# Patient Record
Sex: Male | Born: 1980 | Race: White | Hispanic: No | Marital: Married | Smoking: Current every day smoker
Health system: Southern US, Community
[De-identification: ages and names within clinical notes are randomized; demographics above are authoritative.]

## PROBLEM LIST (undated history)

## (undated) DIAGNOSIS — E7800 Pure hypercholesterolemia, unspecified: Secondary | ICD-10-CM

---

## 2001-02-05 ENCOUNTER — Emergency Department (HOSPITAL_COMMUNITY): Admission: EM | Admit: 2001-02-05 | Discharge: 2001-02-05 | Payer: Self-pay | Admitting: Emergency Medicine

## 2001-02-06 ENCOUNTER — Emergency Department (HOSPITAL_COMMUNITY): Admission: EM | Admit: 2001-02-06 | Discharge: 2001-02-06 | Payer: Self-pay | Admitting: Emergency Medicine

## 2002-08-02 ENCOUNTER — Emergency Department (HOSPITAL_COMMUNITY): Admission: EM | Admit: 2002-08-02 | Discharge: 2002-08-02 | Payer: Self-pay | Admitting: Emergency Medicine

## 2005-12-23 ENCOUNTER — Ambulatory Visit: Payer: Self-pay | Admitting: Internal Medicine

## 2005-12-29 ENCOUNTER — Ambulatory Visit: Payer: Self-pay | Admitting: Internal Medicine

## 2006-04-17 ENCOUNTER — Ambulatory Visit: Payer: Self-pay | Admitting: Family Medicine

## 2007-07-05 ENCOUNTER — Encounter (INDEPENDENT_AMBULATORY_CARE_PROVIDER_SITE_OTHER): Payer: Self-pay | Admitting: *Deleted

## 2007-07-05 ENCOUNTER — Ambulatory Visit: Payer: Self-pay | Admitting: Internal Medicine

## 2007-07-05 DIAGNOSIS — F329 Major depressive disorder, single episode, unspecified: Secondary | ICD-10-CM

## 2007-07-05 DIAGNOSIS — E785 Hyperlipidemia, unspecified: Secondary | ICD-10-CM | POA: Insufficient documentation

## 2007-07-05 DIAGNOSIS — I10 Essential (primary) hypertension: Secondary | ICD-10-CM | POA: Insufficient documentation

## 2007-07-05 DIAGNOSIS — G43009 Migraine without aura, not intractable, without status migrainosus: Secondary | ICD-10-CM | POA: Insufficient documentation

## 2007-07-05 DIAGNOSIS — N289 Disorder of kidney and ureter, unspecified: Secondary | ICD-10-CM | POA: Insufficient documentation

## 2007-07-05 DIAGNOSIS — L989 Disorder of the skin and subcutaneous tissue, unspecified: Secondary | ICD-10-CM | POA: Insufficient documentation

## 2007-07-05 DIAGNOSIS — F411 Generalized anxiety disorder: Secondary | ICD-10-CM | POA: Insufficient documentation

## 2007-07-05 LAB — CONVERTED CEMR LAB
AST: 22 units/L (ref 0–37)
Albumin: 4.1 g/dL (ref 3.5–5.2)
Alkaline Phosphatase: 81 units/L (ref 39–117)
BUN: 15 mg/dL (ref 6–23)
Basophils Absolute: 0 10*3/uL (ref 0.0–0.1)
Bilirubin Urine: NEGATIVE
Chloride: 103 meq/L (ref 96–112)
Cholesterol: 198 mg/dL (ref 0–200)
Creatinine, Ser: 0.7 mg/dL (ref 0.4–1.5)
Eosinophils Absolute: 0.2 10*3/uL (ref 0.0–0.6)
GFR calc non Af Amer: 144 mL/min
HDL: 36.6 mg/dL — ABNORMAL LOW (ref >39.0)
Hemoglobin, Urine: NEGATIVE
Ketones, ur: NEGATIVE mg/dL
MCHC: 35.3 g/dL (ref 30.0–36.0)
Monocytes Relative: 5.5 % (ref 3.0–11.0)
Potassium: 4.3 meq/L (ref 3.5–5.1)
RBC: 5.1 M/uL (ref 4.22–5.81)
RDW: 12.3 % (ref 11.5–14.6)
TSH: 1.22 microintl units/mL (ref 0.35–5.50)
Total Bilirubin: 0.8 mg/dL (ref 0.3–1.2)
Total CHOL/HDL Ratio: 5.4
Triglycerides: 311 mg/dL (ref 0–149)
Urine Glucose: NEGATIVE mg/dL
Urobilinogen, UA: 0.2 (ref 0.0–1.0)
VLDL: 62 mg/dL — ABNORMAL HIGH (ref 0–40)
pH: 6.5 (ref 5.0–8.0)

## 2007-07-06 LAB — CONVERTED CEMR LAB

## 2007-10-20 ENCOUNTER — Encounter: Payer: Self-pay | Admitting: Internal Medicine

## 2007-10-21 ENCOUNTER — Encounter: Payer: Self-pay | Admitting: Internal Medicine

## 2008-09-20 ENCOUNTER — Ambulatory Visit: Payer: Self-pay | Admitting: Internal Medicine

## 2008-09-20 DIAGNOSIS — K645 Perianal venous thrombosis: Secondary | ICD-10-CM | POA: Insufficient documentation

## 2008-11-05 ENCOUNTER — Ambulatory Visit: Payer: Self-pay | Admitting: Internal Medicine

## 2008-11-05 LAB — CONVERTED CEMR LAB
Bilirubin, Direct: 0.1 mg/dL (ref 0.0–0.3)
Calcium: 9.2 mg/dL (ref 8.4–10.5)
Eosinophils Absolute: 0.2 10*3/uL (ref 0.0–0.7)
GFR calc non Af Amer: 121.33 mL/min (ref >60)
Glucose, Bld: 100 mg/dL — ABNORMAL HIGH (ref 70–99)
HCT: 42 % (ref 39.0–52.0)
HDL: 33.2 mg/dL — ABNORMAL LOW (ref >39.00)
Hemoglobin, Urine: NEGATIVE
Hemoglobin: 14.8 g/dL (ref 13.0–17.0)
Ketones, ur: NEGATIVE mg/dL
LDL Cholesterol: 124 mg/dL — ABNORMAL HIGH (ref 0–99)
Monocytes Absolute: 0.3 10*3/uL (ref 0.1–1.0)
Neutrophils Relative %: 63.5 % (ref 43.0–77.0)
Platelets: 145 10*3/uL — ABNORMAL LOW (ref 150.0–400.0)
Potassium: 3.8 meq/L (ref 3.5–5.1)
RBC: 4.79 M/uL (ref 4.22–5.81)
RDW: 13.3 % (ref 11.5–14.6)
Sodium: 142 meq/L (ref 135–145)
Total Bilirubin: 0.9 mg/dL (ref 0.3–1.2)
Total CHOL/HDL Ratio: 6
Triglycerides: 150 mg/dL — ABNORMAL HIGH (ref 0.0–149.0)
Urine Glucose: NEGATIVE mg/dL
Urobilinogen, UA: 0.2 (ref 0.0–1.0)
VLDL: 30 mg/dL (ref 0.0–40.0)

## 2009-06-02 ENCOUNTER — Emergency Department (HOSPITAL_COMMUNITY): Admission: EM | Admit: 2009-06-02 | Discharge: 2009-06-02 | Payer: Self-pay | Admitting: Emergency Medicine

## 2016-07-29 ENCOUNTER — Encounter (HOSPITAL_COMMUNITY): Payer: Self-pay | Admitting: Emergency Medicine

## 2016-07-29 ENCOUNTER — Ambulatory Visit (HOSPITAL_COMMUNITY)
Admission: EM | Admit: 2016-07-29 | Discharge: 2016-07-30 | Disposition: A | Discharge status: Home / Self Care | Payer: Managed Care, Other (non HMO) | Attending: Emergency Medicine | Admitting: Emergency Medicine

## 2016-07-29 ENCOUNTER — Emergency Department (HOSPITAL_COMMUNITY): Payer: Managed Care, Other (non HMO)

## 2016-07-29 ENCOUNTER — Encounter (HOSPITAL_COMMUNITY)
Admission: EM | Disposition: A | Discharge status: Home / Self Care | Payer: Self-pay | Source: Home / Self Care | Attending: Emergency Medicine

## 2016-07-29 DIAGNOSIS — F17200 Nicotine dependence, unspecified, uncomplicated: Secondary | ICD-10-CM | POA: Insufficient documentation

## 2016-07-29 DIAGNOSIS — W540XXA Bitten by dog, initial encounter: Secondary | ICD-10-CM | POA: Insufficient documentation

## 2016-07-29 DIAGNOSIS — S68624A Partial traumatic transphalangeal amputation of right ring finger, initial encounter: Secondary | ICD-10-CM | POA: Insufficient documentation

## 2016-07-29 DIAGNOSIS — S68119A Complete traumatic metacarpophalangeal amputation of unspecified finger, initial encounter: Secondary | ICD-10-CM

## 2016-07-29 HISTORY — PX: I&D EXTREMITY: SHX5045

## 2016-07-29 SURGERY — IRRIGATION AND DEBRIDEMENT EXTREMITY
Anesthesia: General | Site: Finger | Laterality: Right

## 2016-07-29 MED ORDER — SODIUM CHLORIDE 0.9 % IV SOLN
3.0000 g | Freq: Once | INTRAVENOUS | Status: AC
  Administered 2016-07-29: 3 g via INTRAVENOUS
  Filled 2016-07-29: qty 3

## 2016-07-29 MED ORDER — LACTATED RINGERS IV SOLN
INTRAVENOUS | Status: DC | PRN
  Administered 2016-07-29: via INTRAVENOUS

## 2016-07-29 MED ORDER — TETANUS-DIPHTH-ACELL PERTUSSIS 5-2.5-18.5 LF-MCG/0.5 IM SUSP
0.5000 mL | Freq: Once | INTRAMUSCULAR | Status: AC
  Administered 2016-07-29: 0.5 mL via INTRAMUSCULAR
  Filled 2016-07-29: qty 0.5

## 2016-07-29 MED ORDER — PROPOFOL 10 MG/ML IV BOLUS
INTRAVENOUS | Status: AC
  Filled 2016-07-29: qty 20

## 2016-07-29 MED ORDER — SUFENTANIL CITRATE 50 MCG/ML IV SOLN
INTRAVENOUS | Status: AC
  Filled 2016-07-29: qty 1

## 2016-07-29 MED ORDER — HYDROMORPHONE HCL 2 MG/ML IJ SOLN: 1.0000 mg | Freq: Once | INTRAMUSCULAR | Status: DC

## 2016-07-29 MED ORDER — MIDAZOLAM HCL 2 MG/2ML IJ SOLN
INTRAMUSCULAR | Status: AC
  Filled 2016-07-29: qty 2

## 2016-07-29 MED ORDER — HYDROMORPHONE HCL 2 MG/ML IJ SOLN
1.0000 mg | Freq: Once | INTRAMUSCULAR | Status: AC
  Administered 2016-07-29: 1 mg via INTRAVENOUS
  Filled 2016-07-29: qty 1

## 2016-07-29 SURGICAL SUPPLY — 44 items
BANDAGE COBAN STERILE 2 (GAUZE/BANDAGES/DRESSINGS) IMPLANT
BLADE MINI RND TIP GREEN BEAV (BLADE) IMPLANT
BLADE SURG 15 STRL LF DISP TIS (BLADE) ×1 IMPLANT
BLADE SURG 15 STRL SS (BLADE) ×2
BNDG COHESIVE 1X5 TAN STRL LF (GAUZE/BANDAGES/DRESSINGS) ×3 IMPLANT
BNDG COHESIVE 4X5 TAN STRL (GAUZE/BANDAGES/DRESSINGS) ×3 IMPLANT
BNDG CONFORM 2 STRL LF (GAUZE/BANDAGES/DRESSINGS) ×3 IMPLANT
BNDG GAUZE ELAST 4 BULKY (GAUZE/BANDAGES/DRESSINGS) IMPLANT
CHLORAPREP W/TINT 26ML (MISCELLANEOUS) ×3 IMPLANT
CORDS BIPOLAR (ELECTRODE) ×3 IMPLANT
COVER BACK TABLE 60X90IN (DRAPES) ×3 IMPLANT
COVER MAYO STAND STRL (DRAPES) ×3 IMPLANT
CUFF TOURNIQUET SINGLE 18IN (TOURNIQUET CUFF) IMPLANT
DRAPE HALF SHEET 40X57 (DRAPES) ×3 IMPLANT
DRAPE SURG 17X23 STRL (DRAPES) ×3 IMPLANT
DRSG EMULSION OIL 3X3 NADH (GAUZE/BANDAGES/DRESSINGS) ×3 IMPLANT
GAUZE SPONGE 2X2 8PLY STRL LF (GAUZE/BANDAGES/DRESSINGS) ×1 IMPLANT
GAUZE SPONGE 4X4 12PLY STRL (GAUZE/BANDAGES/DRESSINGS) IMPLANT
GLOVE BIO SURGEON STRL SZ7.5 (GLOVE) ×3 IMPLANT
GLOVE BIOGEL PI IND STRL 7.0 (GLOVE) IMPLANT
GLOVE BIOGEL PI IND STRL 8 (GLOVE) ×1 IMPLANT
GLOVE BIOGEL PI INDICATOR 7.0 (GLOVE)
GLOVE BIOGEL PI INDICATOR 8 (GLOVE) ×2
GLOVE ECLIPSE 6.5 STRL STRAW (GLOVE) IMPLANT
GOWN STRL REUS W/ TWL LRG LVL3 (GOWN DISPOSABLE) ×2 IMPLANT
GOWN STRL REUS W/TWL LRG LVL3 (GOWN DISPOSABLE) ×4
GOWN STRL REUS W/TWL XL LVL3 (GOWN DISPOSABLE) ×3 IMPLANT
NEEDLE HYPO 25X1 1.5 SAFETY (NEEDLE) IMPLANT
NS IRRIG 1000ML POUR BTL (IV SOLUTION) ×3 IMPLANT
PACK BASIN DAY SURGERY FS (CUSTOM PROCEDURE TRAY) ×3 IMPLANT
PADDING CAST ABS 4INX4YD NS (CAST SUPPLIES)
PADDING CAST ABS COTTON 4X4 ST (CAST SUPPLIES) IMPLANT
RUBBERBAND STERILE (MISCELLANEOUS) IMPLANT
SET IRRIG Y TYPE TUR BLADDER L (SET/KITS/TRAYS/PACK) IMPLANT
SPONGE GAUZE 2X2 STER 10/PKG (GAUZE/BANDAGES/DRESSINGS) ×2
STOCKINETTE 6  STRL (DRAPES) ×2
STOCKINETTE 6 STRL (DRAPES) ×1 IMPLANT
SUT VICRYL RAPIDE 4-0 (SUTURE) ×3 IMPLANT
SUT VICRYL RAPIDE 4/0 PS 2 (SUTURE) IMPLANT
SYR BULB 3OZ (MISCELLANEOUS) ×3 IMPLANT
SYRINGE 10CC LL (SYRINGE) ×3 IMPLANT
TOWEL OR 17X24 6PK STRL BLUE (TOWEL DISPOSABLE) ×3 IMPLANT
TOWEL OR NON WOVEN STRL DISP B (DISPOSABLE) ×3 IMPLANT
UNDERPAD 30X30 (UNDERPADS AND DIAPERS) ×3 IMPLANT

## 2016-07-29 NOTE — Consult Note (Addendum)
  ORTHOPAEDIC CONSULTATION HISTORY & PHYSICAL REQUESTING PHYSICIAN: Jesse KaplanAnkit Nanavati, MD  Chief Complaint: Right ring finger traumatic amputation  HPI: Jesse Arnold is a 35 y.o. male who presents to the ER for evaluation of a right ring finger traumatic amputation to the DIP joint that occurred as a result of a bite from a large dog just prior to presentation.  The amputated portion was brought with him to the emergency department.  He has received Unasyn and his tetanus was updated.  History reviewed. No pertinent past medical history. History reviewed. No pertinent surgical history. Social History   Social History  . Marital status: Married    Spouse name: N/A  . Number of children: N/A  . Years of education: N/A   Social History Main Topics  . Smoking status: Current Every Day Smoker  . Smokeless tobacco: Never Used  . Alcohol use Yes  . Drug use: No  . Sexual activity: Not Asked   Other Topics Concern  . None   Social History Narrative  . None   History reviewed. No pertinent family history. No Known Allergies Prior to Admission medications   Not on File   Dg Hand 2 View Right  Result Date: 07/29/2016 CLINICAL DATA:  Dog bite EXAM: RIGHT HAND - 2 VIEW COMPARISON:  None. FINDINGS: Soft tissue and bony amputation at the level of the fourth DIP joint. Possible tiny bone fragment within the soft tissues along the ulnar aspect of the remaining distal digit. No subluxation. IMPRESSION: Bony and soft tissue appy to a shin at the level of the fourth DIP joint Electronically Signed   By: Jesse PangKim  Arnold M.D.   On: 07/29/2016 22:46    Positive ROS: All other systems have been reviewed and were otherwise negative with the exception of those mentioned in the HPI and as above.  Physical Exam: Vitals: Refer to EMR. Constitutional:  WD, WN, NAD HEENT:  NCAT, EOMI Neuro/Psych:  Alert & oriented to person, place, and time; appropriate mood & affect Lymphatic: No generalized  extremity edema or lymphadenopathy Extremities / MSK:  The extremities are normal with respect to appearance, ranges of motion, joint stability, muscle strength/tone, sensation, & perfusion except as otherwise noted:  Right ring finger amputation through the DIP joint, without significant fracture.  Soft tissues have yielded at or just distal to the DIP flexion crease.  He is able to flex and extend the remaining stump.  Assessment: Right ring finger traumatic amputation at level of DIP joint, unsuitable for replantation  Plan: I discussed these findings with him and the need for revision amputation in the operating room.  I indicated that the middle phalanx of the shortened slightly to allow for primary wound closure.  Questions were invited and answered.  Goals, risks, and options were reviewed.  Consent was obtained and document executed.  Site was marked by me in ink.  He will be afforded operative treatment as soon as resources allow.  Jesse Astersavid A. Janee Mornhompson, MD      [facility]opaedic & Hand Surgery [facility] [address] [place], [place]  [address] Office: [phone] Mobile: [phone]  07/29/2016, 11:57 PM

## 2016-07-29 NOTE — ED Notes (Signed)
Pt presents approximately an hour after breaking up fighting dogs.  These were his dogs and they are up to date on vaccines.  One of the dogs bit the distal tip of his r ring finger off.  Bleeding controlled.

## 2016-07-29 NOTE — ED Provider Notes (Signed)
[facility]-EMERGENCY DEPT Provider Note   CSN: [id number] Arrival date & time: 07/29/16  2109         History   Chief Complaint Chief Complaint  Patient presents with  . Finger Amputation    Dog Bite    HPI Jesse Arnold is a 35 y.o. male.   Animal Bite  Contact animal:  Dog Location:  Finger Finger injury location:  R ring finger Time since incident:  1 hour Pain details:    Quality:  Aching   Severity:  Moderate   Timing:  Constant   Progression:  Unchanged Incident location:  Home Provoked: provoked   Animal's rabies vaccination status:  Up to date Animal in possession: yes   Tetanus status:  Unknown Relieved by:  Nothing Worsened by:  Nothing Ineffective treatments:  None tried Associated symptoms: no fever and no rash     History reviewed. No pertinent past medical history.  Patient Active Problem List   Diagnosis Date Noted  . 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 HYPERLIPIDEMIA 07/05/2007  . ANXIETY 07/05/2007  . DEPRESSION 07/05/2007  . COMMON MIGRAINE 07/05/2007  . HYPERTENSION 07/05/2007  . RENAL DISEASE 07/05/2007  . SKIN LESIONS, MULTIPLE 07/05/2007    History reviewed. No pertinent surgical history.     Home Medications    Prior to Admission medications   Not on File    Family History No family history on file.  Social History Social History  Substance Use Topics  . Smoking status: Current Every Day Smoker  . Smokeless tobacco: Never Used  . Alcohol use Yes     Allergies   Patient has no known allergies.   Review of Systems Review of Systems  Constitutional: Negative for chills and fever.  HENT: Negative for ear pain and sore throat.   Eyes: Negative for pain and visual disturbance.  Respiratory: Negative for cough and shortness of breath.   Cardiovascular: Negative for chest pain and palpitations.  Gastrointestinal: Negative for abdominal pain and vomiting.  Genitourinary: Negative for dysuria and  hematuria.  Musculoskeletal: Negative for arthralgias and back pain.  Skin: Positive for wound. Negative for color change and rash.  Neurological: Negative for seizures and syncope.  All other systems reviewed and are negative.    Physical Exam Updated Vital Signs BP 138/97 (BP Location: Left Arm)   Pulse 106   Temp 98.1 F (36.7 C) (Oral)   Resp 20   Ht 6\' 2"  (1.88 m)   Wt 111.1 kg   SpO2 95%   BMI 31.46 kg/m   Physical Exam  Constitutional: He is oriented to person, place, and time. He appears well-developed and well-nourished.  HENT:  Head: Normocephalic and atraumatic.  Eyes: Conjunctivae are normal.  Neck: Neck supple.  Cardiovascular: Normal rate and regular rhythm.   No murmur heard. Pulmonary/Chest: Effort normal and breath sounds normal. No respiratory distress.  Abdominal: Soft. There is no tenderness.  Musculoskeletal: He exhibits no edema.       Right hand: He exhibits tenderness, deformity and laceration. He exhibits normal range of motion.  Distal phalanx of the right ring finger is amputated with exposed bone and soft tissue, motor function of the remaining finger is intact and sensation is as well.  Neurological: He is alert and oriented to person, place, and time.  Skin: Skin is warm and dry.  Psychiatric: He has a normal mood and affect.  Nursing note and vitals reviewed.    ED Treatments / Results  Labs (all  labs ordered are listed, but only abnormal results are displayed) Labs Reviewed - No data to display  EKG  EKG Interpretation None       Radiology Dg Hand 2 View Right  Result Date: 07/29/2016 CLINICAL DATA:  Dog bite EXAM: RIGHT HAND - 2 VIEW COMPARISON:  None. FINDINGS: Soft tissue and bony amputation at the level of the fourth DIP joint. Possible tiny bone fragment within the soft tissues along the ulnar aspect of the remaining distal digit. No subluxation. IMPRESSION: Bony and soft tissue appy to a shin at the level of the fourth DIP  joint Electronically Signed   By: Jasmine PangKim  Fujinaga M.D.   On: 07/29/2016 22:46    Procedures Procedures (including critical care time)  Medications Ordered in ED Medications  Tdap (BOOSTRIX) injection 0.5 mL (0.5 mLs Intramuscular Given 07/29/16 2251)  HYDROmorphone (DILAUDID) injection 1 mg (1 mg Intravenous Given 07/29/16 2216)  Ampicillin-Sulbactam (UNASYN) 3 g in sodium chloride 0.9 % 100 mL IVPB (3 g Intravenous New Bag/Given 07/29/16 2247)     Initial Impression / Assessment and Plan / ED Course  I have reviewed the triage vital signs and the nursing notes.  Pertinent labs & imaging results that were available during my care of the patient were reviewed by me and considered in my medical decision making (see chart for details).  Clinical Course    35 year old male with no medical problems was a bit by his dog. He was trying to perk up to dogs were fighting and the distal phalanx of the right ring finger was removed he is brought the digit with him it is secured in soaked gauze and a bag of ice. Exposed bone appears that the middle phalanx is intact we'll get x-rays to evaluate. Unasyn was given as this is of amputation and a dog bite. Dog is vaccinated as his dog was provoked. His tetanus is unknown we will give it.  Does not appear to be any underlying fracture, orthopedics hand specialist Dr. Janee Mornhompson has seen this patient is going to take him to the operating room for revised amputation. Further manage this patient's care please see operating roompulse discharge summary. Vital signs stable time of transfer care.  Final Clinical Impressions(s) / ED Diagnoses   Final diagnoses:  Traumatic amputation of finger without complication, initial encounter  Dog bite, initial encounter    New Prescriptions New Prescriptions   No medications on file     Cherlynn PerchesEric Jakyah Bradby, MD 07/29/16 [id number]    Derwood KaplanAnkit Nanavati, MD 08/01/16 [phone]

## 2016-07-29 NOTE — ED Notes (Addendum)
Pt's consent witnessed by this RN.  Pt to Short Stay 36.

## 2016-07-29 NOTE — ED Triage Notes (Signed)
Pt. presents with right distal ring finger amputation this evening after bitten by a dog at home with moderate bleeding .

## 2016-07-30 ENCOUNTER — Emergency Department (HOSPITAL_COMMUNITY): Payer: Managed Care, Other (non HMO) | Admitting: Certified Registered"

## 2016-07-30 ENCOUNTER — Encounter (HOSPITAL_COMMUNITY): Payer: Self-pay | Admitting: Orthopedic Surgery

## 2016-07-30 MED ORDER — OXYCODONE HCL 5 MG/5ML PO SOLN: 5.0000 mg | Freq: Once | ORAL | Status: DC | PRN

## 2016-07-30 MED ORDER — OXYCODONE HCL 5 MG PO TABS: 5.0000 mg | ORAL_TABLET | Freq: Four times a day (QID) | ORAL | 0 refills | Status: AC | PRN

## 2016-07-30 MED ORDER — ONDANSETRON HCL 4 MG/2ML IJ SOLN: 4.0000 mg | Freq: Once | INTRAMUSCULAR | Status: DC | PRN

## 2016-07-30 MED ORDER — IBUPROFEN 200 MG PO TABS: 600.0000 mg | ORAL_TABLET | Freq: Four times a day (QID) | ORAL | Status: AC | PRN

## 2016-07-30 MED ORDER — LIDOCAINE 2% (20 MG/ML) 5 ML SYRINGE
INTRAMUSCULAR | Status: DC | PRN
  Administered 2016-07-30: 100 mg via INTRAVENOUS

## 2016-07-30 MED ORDER — BUPIVACAINE HCL (PF) 0.5 % IJ SOLN
INTRAMUSCULAR | Status: DC | PRN
  Administered 2016-07-30: 5 mL

## 2016-07-30 MED ORDER — LIDOCAINE HCL (PF) 1 % IJ SOLN
INTRAMUSCULAR | Status: AC
  Filled 2016-07-30: qty 30

## 2016-07-30 MED ORDER — BUPIVACAINE-EPINEPHRINE (PF) 0.5% -1:200000 IJ SOLN: INTRAMUSCULAR | Status: DC | PRN

## 2016-07-30 MED ORDER — LIDOCAINE HCL (PF) 1 % IJ SOLN
INTRAMUSCULAR | Status: DC | PRN
  Administered 2016-07-30: 5 mL

## 2016-07-30 MED ORDER — PROPOFOL 10 MG/ML IV BOLUS
INTRAVENOUS | Status: DC | PRN
  Administered 2016-07-30: 50 mg via INTRAVENOUS

## 2016-07-30 MED ORDER — ACETAMINOPHEN 325 MG PO TABS: 650.0000 mg | ORAL_TABLET | Freq: Four times a day (QID) | ORAL | Status: AC | PRN

## 2016-07-30 MED ORDER — 0.9 % SODIUM CHLORIDE (POUR BTL) OPTIME
TOPICAL | Status: DC | PRN
  Administered 2016-07-30: 1000 mL

## 2016-07-30 MED ORDER — HYDROMORPHONE HCL 2 MG/ML IJ SOLN: 0.2500 mg | INTRAMUSCULAR | Status: DC | PRN

## 2016-07-30 MED ORDER — BUPIVACAINE HCL (PF) 0.5 % IJ SOLN
INTRAMUSCULAR | Status: AC
  Filled 2016-07-30: qty 30

## 2016-07-30 MED ORDER — MIDAZOLAM HCL 5 MG/5ML IJ SOLN
INTRAMUSCULAR | Status: DC | PRN
  Administered 2016-07-30: 2 mg via INTRAVENOUS

## 2016-07-30 MED ORDER — OXYCODONE HCL 5 MG PO TABS: 5.0000 mg | ORAL_TABLET | Freq: Once | ORAL | Status: DC | PRN

## 2016-07-30 MED ORDER — AMOXICILLIN-POT CLAVULANATE 875-125 MG PO TABS: 1.0000 | ORAL_TABLET | Freq: Two times a day (BID) | ORAL | 0 refills | Status: AC

## 2016-07-30 NOTE — Transfer of Care (Signed)
Immediate Anesthesia Transfer of Care Note  Patient: Jesse Arnold  Procedure(s) Performed: Procedure(s): REVISION AMPUTATION RIGHT RING FINGER (Right)  Patient Location: PACU  Anesthesia Type:MAC and Regional  Level of Consciousness: awake, alert , oriented and patient cooperative  Airway & Oxygen Therapy: Patient Spontanous Breathing  Post-op Assessment: Report given to RN, Post -op Vital signs reviewed and stable and Patient moving all extremities X 4  Post vital signs: Reviewed and stable  Last Vitals:  Vitals:   07/29/16 2330 07/30/16 0048  BP: 128/87 118/76  Pulse: 86 88  Resp: 18 18  Temp:  36.5 C    Last Pain:  Vitals:   07/30/16 0048  TempSrc:   PainSc: 1          Complications: No apparent anesthesia complications

## 2016-07-30 NOTE — Op Note (Signed)
07/29/2016 - 07/30/2016  12:43 AM  PATIENT:  Jesse Arnold  35 y.o. male  PRE-OPERATIVE DIAGNOSIS:  R RF traumatic amputation  POST-OPERATIVE DIAGNOSIS:  Same  PROCEDURE:  Revision amputation of right ring finger with local flap closure  SURGEON: Cliffton Astersavid A. Janee Mornhompson, MD  PHYSICIAN ASSISTANT: None  ANESTHESIA:  local and MAC  SPECIMENS:  None  DRAINS:   None  EBL:  less than 50 mL  PREOPERATIVE INDICATIONS:  Jesse Arnold is a  35 y.o. male with traumatic amputation of the right ring finger at the level of the DIP joint.  The risks benefits and alternatives were discussed with the patient preoperatively including but not limited to the risks of infection, bleeding, nerve injury, cardiopulmonary complications, the need for revision surgery, among others, and the patient verbalized understanding and consented to proceed.  OPERATIVE IMPLANTS: None  OPERATIVE PROCEDURE:  Having just received antibiotics in the emergency department, the patient was escorted to the operative theatre and placed in a supine position.  A surgical "time-out" was performed during which the planned procedure, proposed operative site, and the correct patient identity were compared to the operative consent and agreement confirmed by the circulating nurse according to current facility policy.  I performed a digital block with a mixture of plain lidocaine and Marcaine at the base of the ring finger.  The exposed skin was prepped with Betadine scrub followed by Betadine paint and draped in the usual sterile fashion.    A strip of Esmarch was cut and placed as a tourniquet around the base of the ring finger.  The amputation stump was copiously irrigated.  The radial neurovascular bundle was dissected free and amputated more proximally with bipolar electrocautery.  Subperiosteal dissection was carried out around the condyles and neck of the middle phalanx and a bone cutter was used to remove the condyles.  The  volar capsule remained.  The wound was again irrigated and I used a single 4-0 Vicryl Rapide interrupted suture to secure the extensor tendon to the volar plate, thus covering the end of the bone and providing some soft tissue cushion.  The soft tissues had yielded in a way that created an ulnar flap that was proximally based.  This was further fashioned, and the skin edges excisionally debrided, and closure then performed by soft tissue rearrangement of the tip, using 4-0 Vicryl Rapide interrupted sutures.  Tourniquet was released, additional hemostasis wasn't necessary and a light dressing was applied.  He was taken to the recovery room in stable condition.  DISPOSITION: He'll be discharged home today with an antibiotic and analgesic plan, my office will call him tomorrow to arrange follow-up for sometime next week for wound check.

## 2016-07-30 NOTE — Discharge Instructions (Signed)
Discharge Instructions ° ° °Move your fingers as much as possible, making a full fist and fully opening the fist. °Elevate your hand to reduce pain & swelling of the digits.  Ice over the operative site may be helpful to reduce pain & swelling.  DO NOT USE HEAT. °Pain medicine has been prescribed for you.  °Use your medicine as needed over the first 48 hours, and then you can begin to taper your use.  You may use Tylenol in place of your prescribed pain medication, but not IN ADDITION to it. °Leave the dressing in place until you return to our office.  °You may shower, but keep the bandage clean & dry.  °You may drive a car when you are off of prescription pain medications and can safely control your vehicle with both hands. °Our office will call you to arrange follow-up ° ° °Please call [phone] during normal business hours or [phone] after hours for any problems. Including the following: ° °- excessive redness of the incisions °- drainage for more than 4 days °- fever of more than 101.5 F ° °*Please note that pain medications will not be refilled after hours or on weekends. ° ° °

## 2016-07-30 NOTE — Anesthesia Preprocedure Evaluation (Addendum)
Anesthesia Evaluation  Patient identified by MRN, date of birth, ID band Patient awake    Reviewed: Allergy & Precautions, NPO status , Patient's Chart, lab work & pertinent test results  Airway Mallampati: II  TM Distance: >3 FB Neck ROM: Full    Dental  (+) Teeth Intact, Dental Advisory Given   Pulmonary Current Smoker,    breath sounds clear to auscultation       Cardiovascular  Rhythm:Regular Rate:Normal     Neuro/Psych    GI/Hepatic   Endo/Other    Renal/GU      Musculoskeletal   Abdominal   Peds  Hematology   Anesthesia Other Findings   Reproductive/Obstetrics                             Anesthesia Physical Anesthesia Plan  ASA: II and emergent  Anesthesia Plan: MAC   Post-op Pain Management:    Induction: Intravenous  Airway Management Planned: Natural Airway and Simple Face Mask  Additional Equipment:   Intra-op Plan:   Post-operative Plan:   Informed Consent: I have reviewed the patients History and Physical, chart, labs and discussed the procedure including the risks, benefits and alternatives for the proposed anesthesia with the patient or authorized representative who has indicated his/her understanding and acceptance.     Plan Discussed with: CRNA and Anesthesiologist  Anesthesia Plan Comments:        Anesthesia Quick Evaluation

## 2016-07-31 NOTE — Anesthesia Postprocedure Evaluation (Signed)
Anesthesia Post Note  Patient: Jesse Arnold  Procedure(s) Performed: Procedure(s) (LRB): REVISION AMPUTATION RIGHT RING FINGER (Right)  Patient location during evaluation: PACU Anesthesia Type: MAC Level of consciousness: awake, awake and alert and oriented Vital Signs Assessment: post-procedure vital signs reviewed and stable Respiratory status: spontaneous breathing, nonlabored ventilation and respiratory function stable Cardiovascular status: blood pressure returned to baseline Anesthetic complications: no    Last Vitals:  Vitals:   07/30/16 0048 07/30/16 0100  BP: 118/76 116/75  Pulse: 88 88  Resp: 18 16  Temp: 36.5 C 36.6 C    Last Pain:  Vitals:   07/30/16 0048  TempSrc:   PainSc: 1                  Shamel Germond COKER

## 2017-01-14 ENCOUNTER — Encounter (HOSPITAL_BASED_OUTPATIENT_CLINIC_OR_DEPARTMENT_OTHER): Payer: Self-pay | Admitting: *Deleted

## 2017-01-14 ENCOUNTER — Emergency Department (HOSPITAL_BASED_OUTPATIENT_CLINIC_OR_DEPARTMENT_OTHER)
Admission: EM | Admit: 2017-01-14 | Discharge: 2017-01-14 | Disposition: A | Discharge status: Home / Self Care | Payer: 59 | Attending: Emergency Medicine | Admitting: Emergency Medicine

## 2017-01-14 ENCOUNTER — Emergency Department (HOSPITAL_BASED_OUTPATIENT_CLINIC_OR_DEPARTMENT_OTHER): Payer: 59

## 2017-01-14 ENCOUNTER — Emergency Department (HOSPITAL_COMMUNITY): Payer: 59

## 2017-01-14 DIAGNOSIS — E785 Hyperlipidemia, unspecified: Secondary | ICD-10-CM | POA: Diagnosis not present

## 2017-01-14 DIAGNOSIS — Z79899 Other long term (current) drug therapy: Secondary | ICD-10-CM | POA: Diagnosis not present

## 2017-01-14 DIAGNOSIS — R51 Headache: Secondary | ICD-10-CM | POA: Diagnosis present

## 2017-01-14 DIAGNOSIS — R519 Headache, unspecified: Secondary | ICD-10-CM

## 2017-01-14 DIAGNOSIS — F17200 Nicotine dependence, unspecified, uncomplicated: Secondary | ICD-10-CM | POA: Diagnosis not present

## 2017-01-14 HISTORY — DX: Pure hypercholesterolemia, unspecified: E78.00

## 2017-01-14 LAB — BASIC METABOLIC PANEL
Anion gap: 10 (ref 5–15)
BUN: 14 mg/dL (ref 6–20)
CALCIUM: 9.1 mg/dL (ref 8.9–10.3)
CHLORIDE: 103 mmol/L (ref 101–111)
CO2: 24 mmol/L (ref 22–32)
CREATININE: 0.83 mg/dL (ref 0.61–1.24)
GFR calc Af Amer: >60 mL/min (ref >60)
GFR calc non Af Amer: >60 mL/min (ref >60)
GLUCOSE: 87 mg/dL (ref 65–99)
Potassium: 3.8 mmol/L (ref 3.5–5.1)
Sodium: 137 mmol/L (ref 135–145)

## 2017-01-14 LAB — PROTIME-INR
INR: 0.97
Prothrombin Time: 12.9 seconds (ref 11.4–15.2)

## 2017-01-14 LAB — CBC WITH DIFFERENTIAL/PLATELET
BASOS PCT: 1 %
Basophils Absolute: 0 10*3/uL (ref 0.0–0.1)
Eosinophils Absolute: 0.2 10*3/uL (ref 0.0–0.7)
Eosinophils Relative: 4 %
HEMATOCRIT: 42 % (ref 39.0–52.0)
HEMOGLOBIN: 14.7 g/dL (ref 13.0–17.0)
Lymphocytes Relative: 32 %
Lymphs Abs: 2 10*3/uL (ref 0.7–4.0)
MCH: 30.1 pg (ref 26.0–34.0)
MCHC: 35 g/dL (ref 30.0–36.0)
MCV: 86.1 fL (ref 78.0–100.0)
MONO ABS: 0.5 10*3/uL (ref 0.1–1.0)
MONOS PCT: 8 %
NEUTROS ABS: 3.6 10*3/uL (ref 1.7–7.7)
NEUTROS PCT: 55 %
Platelets: 173 10*3/uL (ref 150–400)
RBC: 4.88 MIL/uL (ref 4.22–5.81)
RDW: 13.4 % (ref 11.5–15.5)
WBC: 6.4 10*3/uL (ref 4.0–10.5)

## 2017-01-14 MED ORDER — PROCHLORPERAZINE EDISYLATE 5 MG/ML IJ SOLN
10.0000 mg | Freq: Once | INTRAMUSCULAR | Status: AC
  Administered 2017-01-14: 10 mg via INTRAVENOUS
  Filled 2017-01-14: qty 2

## 2017-01-14 MED ORDER — SODIUM CHLORIDE 0.9 % IV BOLUS (SEPSIS)
1000.0000 mL | Freq: Once | INTRAVENOUS | Status: AC
  Administered 2017-01-14: 1000 mL via INTRAVENOUS

## 2017-01-14 MED ORDER — IOPAMIDOL (ISOVUE-370) INJECTION 76%
100.0000 mL | Freq: Once | INTRAVENOUS | Status: AC | PRN
  Administered 2017-01-14: 100 mL via INTRAVENOUS

## 2017-01-14 MED ORDER — DIPHENHYDRAMINE HCL 50 MG/ML IJ SOLN
25.0000 mg | Freq: Once | INTRAMUSCULAR | Status: AC
  Administered 2017-01-14: 25 mg via INTRAVENOUS
  Filled 2017-01-14: qty 1

## 2017-01-14 NOTE — ED Notes (Signed)
Patient transported to MRI 

## 2017-01-14 NOTE — ED Provider Notes (Signed)
[facility] Provider Note   CSN: [id number] Arrival date & time: 01/14/17  1500     History   Chief Complaint Chief Complaint  Patient presents with  . Headache    HPI Jesse Arnold is a 36 y.o. male.  The history is provided by the patient and medical records.  Headache   This is a new problem. The current episode started more than 1 week ago. The problem occurs constantly. The problem has not changed since onset.The headache is associated with intercourse. The pain is located in the occipital region. The quality of the pain is described as dull. The pain is at a severity of 4/10. The pain is moderate. The pain does not radiate. Pertinent negatives include no fever, no malaise/fatigue, no chest pressure, no near-syncope, no palpitations, no shortness of breath, no nausea and no vomiting. He has tried nothing for the symptoms. The treatment provided no relief.    Past Medical History:  Diagnosis Date  . High cholesterol     Patient Active Problem List   Diagnosis Date Noted  . 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 HYPERLIPIDEMIA 07/05/2007  . ANXIETY 07/05/2007  . DEPRESSION 07/05/2007  . COMMON MIGRAINE 07/05/2007  . HYPERTENSION 07/05/2007  . RENAL DISEASE 07/05/2007  . SKIN LESIONS, MULTIPLE 07/05/2007    Past Surgical History:  Procedure Laterality Date  . I&D EXTREMITY Right 07/29/2016   Procedure: REVISION AMPUTATION RIGHT RING FINGER;  Surgeon: Mack Hook, MD;  Location: [facility] OR;  Service: Orthopedics;  Laterality: Right;       Home Medications    Prior to Admission medications   Medication Sig Start Date End Date Taking? Authorizing Provider  acetaminophen (TYLENOL) 325 MG tablet Take 2 tablets (650 mg total) by mouth every 6 (six) hours as needed for mild pain or moderate pain. 07/30/16  Yes Mack Hook, MD  Amphetamine-Dextroamphetamine (ADDERALL XR PO) Take by mouth.   Yes [provider]  ibuprofen (ADVIL) 200  MG tablet Take 3 tablets (600 mg total) by mouth every 6 (six) hours as needed for mild pain or moderate pain. 07/30/16  Yes Mack Hook, MD  pravastatin (PRAVACHOL) 10 MG tablet Take 10 mg by mouth daily.   Yes [provider]  oxyCODONE (ROXICODONE) 5 MG immediate release tablet Take 1 tablet (5 mg total) by mouth every 6 (six) hours as needed for severe pain. 07/30/16   Mack Hook, MD    Family History No family history on file.  Social History Social History  Substance Use Topics  . Smoking status: Current Every Day Smoker  . Smokeless tobacco: Never Used  . Alcohol use Yes     Allergies   Patient has no known allergies.   Review of Systems Review of Systems  Constitutional: Negative for chills, diaphoresis, fatigue, fever and malaise/fatigue.  HENT: Negative for congestion and rhinorrhea.   Eyes: Negative for visual disturbance.  Respiratory: Negative for chest tightness, shortness of breath, wheezing and stridor.   Cardiovascular: Negative for palpitations and near-syncope.  Gastrointestinal: Negative for constipation, diarrhea, nausea and vomiting.  Genitourinary: Negative for dysuria and flank pain.  Musculoskeletal: Positive for gait problem (yesterday, resolved). Negative for neck pain and neck stiffness.  Skin: Negative for rash and wound.  Neurological: Positive for speech difficulty (resolved yesterday) and headaches. Negative for dizziness, facial asymmetry and light-headedness.  Psychiatric/Behavioral: Negative for agitation.  All other systems reviewed and are negative.    Physical Exam Updated Vital Signs BP (!) 132/93 (BP  Location: Left Arm)   Pulse 97   Temp 98.4 F (36.9 C) (Oral)   Resp (!) 21   SpO2 98%   Physical Exam  Constitutional: He is oriented to person, place, and time. He appears well-developed and well-nourished. No distress.  HENT:  Head: Normocephalic and atraumatic.  Left Ear: External ear normal.  Mouth/Throat:  Oropharynx is clear and moist. No oropharyngeal exudate.  Eyes: Conjunctivae and EOM are normal. Pupils are equal, round, and reactive to light.  Neck: Normal range of motion. Neck supple.  Cardiovascular: Normal rate, regular rhythm and intact distal pulses.   No murmur heard. Pulmonary/Chest: Effort normal and breath sounds normal. No stridor. No respiratory distress. He has no wheezes. He exhibits no tenderness.  Abdominal: Soft. There is no tenderness.  Musculoskeletal: He exhibits no edema or tenderness.  Neurological: He is alert and oriented to person, place, and time. He is not disoriented. He displays no tremor and normal reflexes. No cranial nerve deficit or sensory deficit. He exhibits normal muscle tone. He displays no seizure activity. Coordination and gait normal. GCS eye subscore is 4. GCS verbal subscore is 5. GCS motor subscore is 6.  Skin: Skin is warm and dry. Capillary refill takes less than 2 seconds. He is not diaphoretic. No erythema. No pallor.  Psychiatric: He has a normal mood and affect.  Nursing note and vitals reviewed.    ED Treatments / Results  Labs (all labs ordered are listed, but only abnormal results are displayed) Labs Reviewed  CBC WITH DIFFERENTIAL/PLATELET  BASIC METABOLIC PANEL  PROTIME-INR    EKG  EKG Interpretation None       Radiology Ct Angio Head W Or Wo Contrast  Result Date: 01/14/2017 CLINICAL DATA:  36 year old male with intermittent headache, beginning after intercourse 1 week ago. Pain radiates from the vertex to the occiput in down the back of the neck. Transient neurologic deficits yesterday. EXAM: CT HEAD WITHOUT CONTRAST CT ANGIOGRAPHY HEAD AND NECK TECHNIQUE: Multidetector CT imaging of the head and neck was performed using the standard protocol during bolus administration of intravenous contrast. Multiplanar CT image reconstructions and MIPs were obtained to evaluate the vascular anatomy. Carotid stenosis measurements (when  applicable) are obtained utilizing NASCET criteria, using the distal internal carotid diameter as the denominator. CONTRAST:  100 mL Isovue 370 COMPARISON:  None. FINDINGS: CT HEAD Brain: No midline shift, ventriculomegaly, mass effect, evidence of mass lesion, intracranial hemorrhage or evidence of cortically based acute infarction. Gray-white matter differentiation is within normal limits throughout the brain. Calvarium and skull base: Negative. Paranasal sinuses: Clear. Orbits: Visualized orbits and scalp soft tissues are within normal limits. CTA NECK Skeleton: Negative. No acute osseous abnormality identified. No cervical spine degeneration identified. Upper chest: Mediastinal lipomatosis. No superior mediastinal lymphadenopathy. Mild upper lung atelectasis. Other neck: Negative thyroid, larynx, pharynx, parapharyngeal spaces, retropharyngeal space, sublingual space, submandibular glands and parotid glands. Cervical lymph nodes are within normal limits. Aortic arch: 3 vessel arch configuration. No arch atherosclerosis or stenosis. Right carotid system: Mild obscuration of the brachiocephalic artery due to dense right subclavian vein contrast streak artifact. Negative right CCA. Negative right carotid bifurcation. Negative cervical right ICA. Left carotid system: Negative left CCA. Negative left carotid bifurcation. Negative cervical left ICA. Vertebral arteries: Proximal right subclavian artery mildly obscured by the subclavian vein contrast artifact but no proximal right subclavian stenosis suspected. The right vertebral artery origin appears normal. Normal right vertebral artery to the skullbase. Normal proximal left subclavian artery. Normal left  vertebral artery origin. Normal left vertebral artery to the skullbase. Fairly codominant vertebral arteries. CTA HEAD Posterior circulation: Codominant distal vertebral arteries without stenosis. Normal PICA origins. Patent vertebrobasilar junction. Patent but  diminutive appearing basilar artery. SCA and left PCA origins are normal. Fetal type right PCA origin. Diminutive or absent left posterior communicating artery. Bilateral PCA branches are within normal limits. Anterior circulation: Both ICA siphons are patent without atherosclerosis or stenosis. Normal ophthalmic and right posterior communicating artery origins. Patent carotid termini. Normal MCA and ACA origins. Normal anterior communicating artery and bilateral ACA branches. Left MCA M1 segment, bifurcation, and left MCA branches are within normal limits. Right MCA M1 segment, bifurcation, and right MCA branches are within normal limits. Venous sinuses: Patent. Anatomic variants: Fetal type right PCA origin. Delayed phase: No abnormal enhancement identified. Review of the MIP images confirms the above findings IMPRESSION: 1. No arterial abnormality identified on CTA Head and Neck. Diminutive basilar artery appears to beyond the basis of normal anatomic variation (fetal type right PCA origin). 2.  Normal CT appearance of the brain. 3. Negative CT appearance of the neck. Electronically Signed   By: Odessa Fleming M.D.   On: 01/14/2017 17:46   Ct Head Wo Contrast  Result Date: 01/14/2017 CLINICAL DATA:  36 year old male with intermittent headache, beginning after intercourse 1 week ago. Pain radiates from the vertex to the occiput in down the back of the neck. Transient neurologic deficits yesterday. EXAM: CT HEAD WITHOUT CONTRAST CT ANGIOGRAPHY HEAD AND NECK TECHNIQUE: Multidetector CT imaging of the head and neck was performed using the standard protocol during bolus administration of intravenous contrast. Multiplanar CT image reconstructions and MIPs were obtained to evaluate the vascular anatomy. Carotid stenosis measurements (when applicable) are obtained utilizing NASCET criteria, using the distal internal carotid diameter as the denominator. CONTRAST:  100 mL Isovue 370 COMPARISON:  None. FINDINGS: CT HEAD Brain:  No midline shift, ventriculomegaly, mass effect, evidence of mass lesion, intracranial hemorrhage or evidence of cortically based acute infarction. Gray-white matter differentiation is within normal limits throughout the brain. Calvarium and skull base: Negative. Paranasal sinuses: Clear. Orbits: Visualized orbits and scalp soft tissues are within normal limits. CTA NECK Skeleton: Negative. No acute osseous abnormality identified. No cervical spine degeneration identified. Upper chest: Mediastinal lipomatosis. No superior mediastinal lymphadenopathy. Mild upper lung atelectasis. Other neck: Negative thyroid, larynx, pharynx, parapharyngeal spaces, retropharyngeal space, sublingual space, submandibular glands and parotid glands. Cervical lymph nodes are within normal limits. Aortic arch: 3 vessel arch configuration. No arch atherosclerosis or stenosis. Right carotid system: Mild obscuration of the brachiocephalic artery due to dense right subclavian vein contrast streak artifact. Negative right CCA. Negative right carotid bifurcation. Negative cervical right ICA. Left carotid system: Negative left CCA. Negative left carotid bifurcation. Negative cervical left ICA. Vertebral arteries: Proximal right subclavian artery mildly obscured by the subclavian vein contrast artifact but no proximal right subclavian stenosis suspected. The right vertebral artery origin appears normal. Normal right vertebral artery to the skullbase. Normal proximal left subclavian artery. Normal left vertebral artery origin. Normal left vertebral artery to the skullbase. Fairly codominant vertebral arteries. CTA HEAD Posterior circulation: Codominant distal vertebral arteries without stenosis. Normal PICA origins. Patent vertebrobasilar junction. Patent but diminutive appearing basilar artery. SCA and left PCA origins are normal. Fetal type right PCA origin. Diminutive or absent left posterior communicating artery. Bilateral PCA branches are  within normal limits. Anterior circulation: Both ICA siphons are patent without atherosclerosis or stenosis. Normal ophthalmic and right posterior communicating  artery origins. Patent carotid termini. Normal MCA and ACA origins. Normal anterior communicating artery and bilateral ACA branches. Left MCA M1 segment, bifurcation, and left MCA branches are within normal limits. Right MCA M1 segment, bifurcation, and right MCA branches are within normal limits. Venous sinuses: Patent. Anatomic variants: Fetal type right PCA origin. Delayed phase: No abnormal enhancement identified. Review of the MIP images confirms the above findings IMPRESSION: 1. No arterial abnormality identified on CTA Head and Neck. Diminutive basilar artery appears to beyond the basis of normal anatomic variation (fetal type right PCA origin). 2.  Normal CT appearance of the brain. 3. Negative CT appearance of the neck. Electronically Signed   By: Odessa Fleming M.D.   On: 01/14/2017 17:46   Ct Angio Neck W And/or Wo Contrast  Result Date: 01/14/2017 CLINICAL DATA:  36 year old male with intermittent headache, beginning after intercourse 1 week ago. Pain radiates from the vertex to the occiput in down the back of the neck. Transient neurologic deficits yesterday. EXAM: CT HEAD WITHOUT CONTRAST CT ANGIOGRAPHY HEAD AND NECK TECHNIQUE: Multidetector CT imaging of the head and neck was performed using the standard protocol during bolus administration of intravenous contrast. Multiplanar CT image reconstructions and MIPs were obtained to evaluate the vascular anatomy. Carotid stenosis measurements (when applicable) are obtained utilizing NASCET criteria, using the distal internal carotid diameter as the denominator. CONTRAST:  100 mL Isovue 370 COMPARISON:  None. FINDINGS: CT HEAD Brain: No midline shift, ventriculomegaly, mass effect, evidence of mass lesion, intracranial hemorrhage or evidence of cortically based acute infarction. Gray-white matter  differentiation is within normal limits throughout the brain. Calvarium and skull base: Negative. Paranasal sinuses: Clear. Orbits: Visualized orbits and scalp soft tissues are within normal limits. CTA NECK Skeleton: Negative. No acute osseous abnormality identified. No cervical spine degeneration identified. Upper chest: Mediastinal lipomatosis. No superior mediastinal lymphadenopathy. Mild upper lung atelectasis. Other neck: Negative thyroid, larynx, pharynx, parapharyngeal spaces, retropharyngeal space, sublingual space, submandibular glands and parotid glands. Cervical lymph nodes are within normal limits. Aortic arch: 3 vessel arch configuration. No arch atherosclerosis or stenosis. Right carotid system: Mild obscuration of the brachiocephalic artery due to dense right subclavian vein contrast streak artifact. Negative right CCA. Negative right carotid bifurcation. Negative cervical right ICA. Left carotid system: Negative left CCA. Negative left carotid bifurcation. Negative cervical left ICA. Vertebral arteries: Proximal right subclavian artery mildly obscured by the subclavian vein contrast artifact but no proximal right subclavian stenosis suspected. The right vertebral artery origin appears normal. Normal right vertebral artery to the skullbase. Normal proximal left subclavian artery. Normal left vertebral artery origin. Normal left vertebral artery to the skullbase. Fairly codominant vertebral arteries. CTA HEAD Posterior circulation: Codominant distal vertebral arteries without stenosis. Normal PICA origins. Patent vertebrobasilar junction. Patent but diminutive appearing basilar artery. SCA and left PCA origins are normal. Fetal type right PCA origin. Diminutive or absent left posterior communicating artery. Bilateral PCA branches are within normal limits. Anterior circulation: Both ICA siphons are patent without atherosclerosis or stenosis. Normal ophthalmic and right posterior communicating artery  origins. Patent carotid termini. Normal MCA and ACA origins. Normal anterior communicating artery and bilateral ACA branches. Left MCA M1 segment, bifurcation, and left MCA branches are within normal limits. Right MCA M1 segment, bifurcation, and right MCA branches are within normal limits. Venous sinuses: Patent. Anatomic variants: Fetal type right PCA origin. Delayed phase: No abnormal enhancement identified. Review of the MIP images confirms the above findings IMPRESSION: 1. No arterial abnormality identified on CTA  Head and Neck. Diminutive basilar artery appears to beyond the basis of normal anatomic variation (fetal type right PCA origin). 2.  Normal CT appearance of the brain. 3. Negative CT appearance of the neck. Electronically Signed   By: Odessa FlemingH  Hall M.D.   On: 01/14/2017 17:46   Mr Brain Wo Contrast  Result Date: 01/14/2017 CLINICAL DATA:  Headache for 1 week EXAM: MRI HEAD WITHOUT CONTRAST TECHNIQUE: Multiplanar, multiecho pulse sequences of the brain and surrounding structures were obtained without intravenous contrast. COMPARISON:  CTA head and neck 01/14/2017 FINDINGS: Brain: The midline structures are normal. There is no focal diffusion restriction to indicate acute infarct. The brain parenchymal signal is normal and there is no mass lesion. No intraparenchymal hematoma or chronic microhemorrhage. Brain volume is normal for age without age-advanced or lobar predominant atrophy. The dura is normal and there is no extra-axial collection. Vascular: Major intracranial arterial and venous sinus flow voids are preserved. Skull and upper cervical spine: The visualized skull base, calvarium, upper cervical spine and extracranial soft tissues are normal. Sinuses/Orbits: No fluid levels or advanced mucosal thickening. No mastoid effusion. Normal orbits. IMPRESSION: Normal MRI of the brain. Electronically Signed   By: Deatra RobinsonKevin  Herman M.D.   On: 01/14/2017 22:43    Procedures Procedures (including critical  care time)  Medications Ordered in ED Medications  sodium chloride 0.9 % bolus 1,000 mL (0 mLs Intravenous Stopped 01/14/17 1819)  iopamidol (ISOVUE-370) 76 % injection 100 mL (100 mLs Intravenous Contrast Given 01/14/17 1710)  prochlorperazine (COMPAZINE) injection 10 mg (10 mg Intravenous Given 01/14/17 1816)  diphenhydrAMINE (BENADRYL) injection 25 mg (25 mg Intravenous Given 01/14/17 1816)     Initial Impression / Assessment and Plan / ED Course  I have reviewed the triage vital signs and the nursing notes.  Pertinent labs & imaging results that were available during my care of the patient were reviewed by me and considered in my medical decision making (see chart for details).     Tally Geralyn Corwin Baxendale is a 36 y.o. male with a past medical history significant for migraines and hypercholesterolemia who presents with headache. Patient reports that he has had headache ongoing for approximately one week. He reports that his headache started during the midst of intercourse and was severe in onset. He reports that it was "like I was hit with a hammer in the back of my head". He says the headache has been located in the back of his head all week. He reports that it waxes and wanes but has been constant. He says that it has not responded to over-the-counter pain medications. He says that it feels very different than his prior migraines. He denies photophobia or phonophobia that he typically has migraines.  Patient reports that yesterday, while at the grocery store, he had onset of new symptoms. He reports that his headache worsened to a 9 out of 10, he began having difficulty walking and had to stabilize himself with the shopping cart, and he had difficulty speaking. He reports that he was having difficulty putting words together into sentences and felt confused. He says this lasted for several hours and he went to bed. He reports that this morning waking up he has not had the difficulty speaking but is  still having headache. He describes it as a 4 out of 10 severity on arrival. He reports that he has been having nausea on and off but no vomiting. He denies any visual changes. He has report feeling lightheaded at times.  He reports some mild fatigue.  Patient denies any fevers, chills, recent trauma. He denies any problems with his arms or legs but does report his difficulty walking yesterday. He denies other symptoms or history of the same.  On exam, patient had no focal neurologic deficits. Pupils were symmetric. Extraocular movements intact. No sensation or coordination on rounds. No gait abnormality. Normal strength and alternatives. No nuchal rigidity. Lungs clear and abdomen nontender. Chest nontender.  Based on description of symptoms, there is concern for coital headache versus migraine versus subarachnoid hemorrhage versus TIA given his transient neurologic symptoms yesterday.  Neurology recommended obtaining the CT and CTA of the head and neck to look for bleed, aneurysm, or other pathology. Discussion held with patient of lumbar puncture to look for xanthochromia however, shared decision-making conversation led to decision to avoid this and get the CTA. If the imaging is negative, neurology recommended MRI to workup TIA given the episode yesterday of gait abnormality and difficulty speaking.  7:47 PM CT imaging showed showed no evidence of aneurysm. No evidence of intracranial bleed. Normal head CT.  Laboratory testing also reassuring.   Patient reported his head pain has continued to improve after headache cocktail.  Patient will be transferred to [facility] for MRI without contrast of the head to look for TIA or stroke. If MRI is negative, feel patient is safe for discharge with diagnosis of coital headache versus migraine with atypical symptoms.   Patient will go POV and present to the [facility] emergency department for his imaging and evaluation.  Patient transferred POV in  stable condition.   Final Clinical Impressions(s) / ED Diagnoses   Final diagnoses:  Acute nonintractable headache, unspecified headache type    Clinical Impression: 1. Acute nonintractable headache, unspecified headache type     Disposition: Transfer to [facility] ED for MRI    Tegeler, Canary Brim, MD 01/15/17 1312

## 2017-01-14 NOTE — ED Notes (Signed)
ED Physician at bedside  

## 2017-01-14 NOTE — ED Notes (Signed)
PT states understanding of care given, follow up care. PT ambulated from ED to car with a steady gait.  

## 2017-01-14 NOTE — Discharge Instructions (Addendum)
Your MRI is normal today.  Please continue ibuprofen as needed for headache. Neurology referral provided for you for follow-up Our neurologist recommend that you avoid exertional activity until your headaches stop as something that can cause severe rebound headaches.

## 2017-01-14 NOTE — ED Triage Notes (Signed)
Headache for a week. States he had a hard time putting sentences together last night. Speech has been clear today. He is alert, neuro intact. Drove himself here from work.

## 2017-01-14 NOTE — ED Provider Notes (Signed)
Please see previous provider's note regarding patient's presenting history and physical. Patient was transferred from [facility] for MRI of the brain. He is a 36 year old with history of migraine headaches who presents with atypical headaches as well as neuro symptoms yesterday that is now resolved. Daily headaches that started about one week ago after having sexual intercourse with his wife. States that initially pain was sudden onset and severe, but gradually eased off but ongoing over the past week. Yesterday, noticed that for several hours he had word finding difficulties.   Ad[facility], he had a normal CT head, CTA of the head and neck. Previous provider had spoken with neurology who recommended transfer to [facility] for an MRI of the brain.   MRI negative for acute intracranial processes. Spoke with Dr. Amada JupiterKirkpatrick who felt patient appropriate for discharge with outpatient neuro follow-up. He did not feel LP necessary given normal MRI and CTA head and neck. Strict return and follow-up instructions reviewed. He expressed understanding of all discharge instructions and felt comfortable with the plan of care.    Lavera GuiseLiu, Drelyn Pistilli Duo, MD 01/14/17 [phone]

## 2017-01-14 NOTE — ED Notes (Addendum)
Throbbing pain verbalized when he woke up this morning.  Sharp and throbbing pain on Memorial day. Headache has been going on for a week. He felt last headed last night but not today.

## 2018-05-11 IMAGING — MR MR HEAD W/O CM
9 of 10 series · 35 of 48 positions shown · non-contrast
Comparison: CTA head and neck 01/14/2017

CLINICAL DATA: Headache for 1 week

EXAM:
MRI HEAD WITHOUT CONTRAST
TECHNIQUE: Multiplanar, multiecho pulse sequences of the brain and surrounding
structures were obtained without intravenous contrast.

[Series 3: DWI · axial · 3.0mm · 0.94mm/px · z∈[-9,+135]mm · 8 of 98 slices shown (1 of 2)]
[im 1/98]
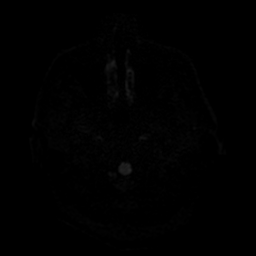
[im 11/98]
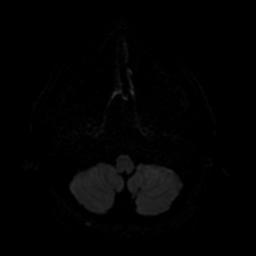
[im 33/98]
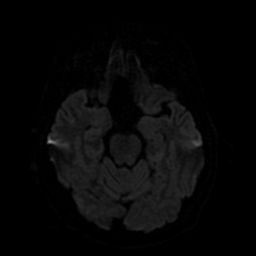
[im 44/98]
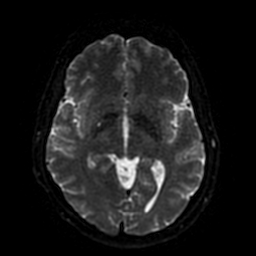
[im 54/98]
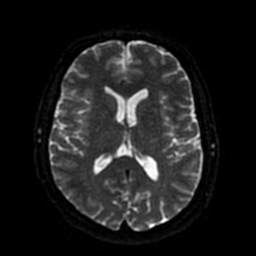
[im 65/98]
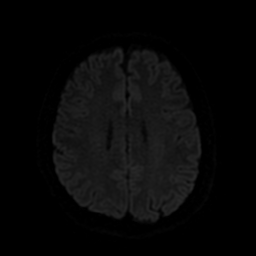
[im 87/98]
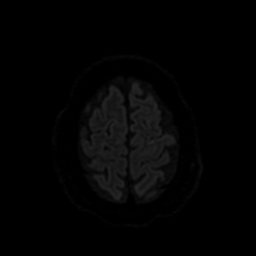
[im 98/98]
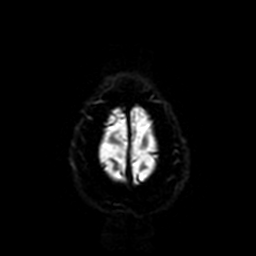

[Series 4: T2 · axial · 5.0mm · 0.47mm/px · z∈[-9,+135]mm · 2 of 25 slices shown (1 of 2)]
[im 1/25]
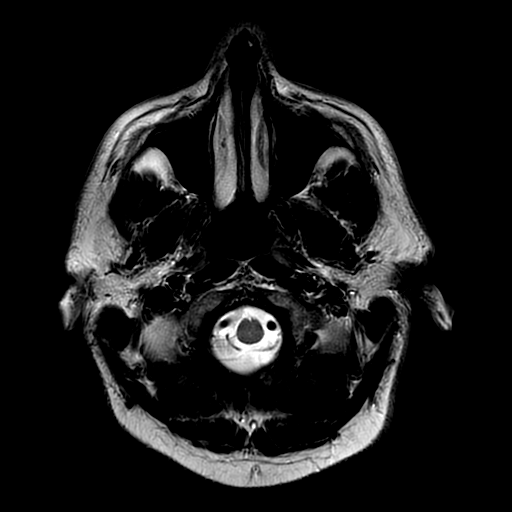
[im 25/25]
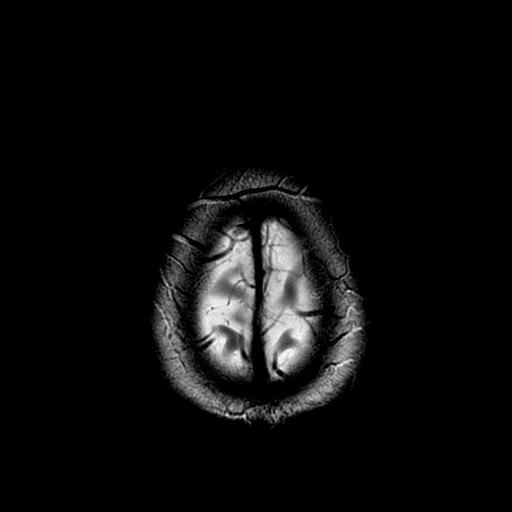

[Series 5: DWI · coronal · 4.0mm · 0.94mm/px · 7 of 72 slices shown (2 of 2)]
[im 1/72]
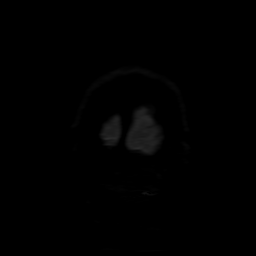
[im 12/72]
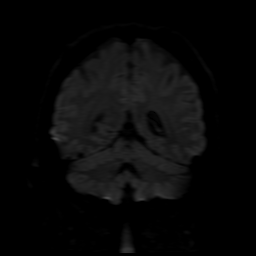
[im 24/72]
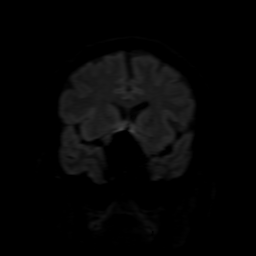
[im 36/72]
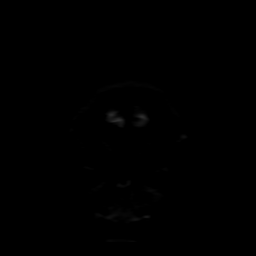
[im 48/72]
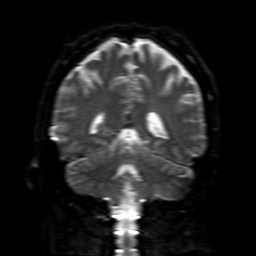
[im 60/72]
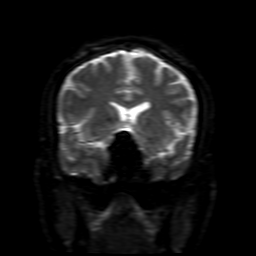
[im 72/72]
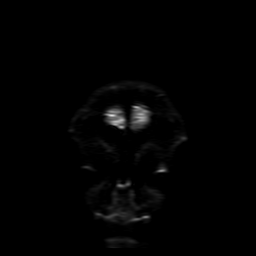

[Series 6: FLAIR · axial · 5.0mm · 0.47mm/px · z∈[-9,+135]mm · 2 of 25 slices shown (1 of 2)]
[im 1/25]
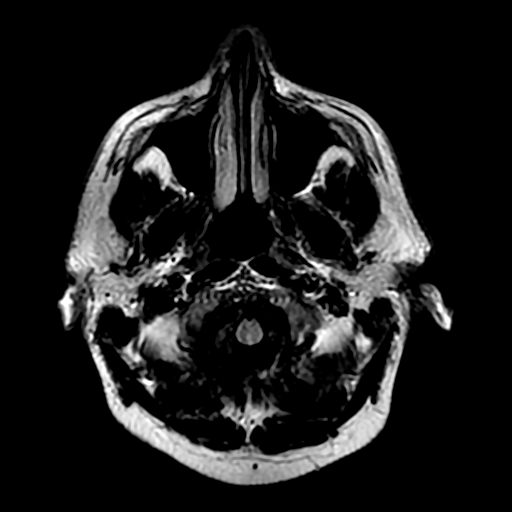
[im 25/25]
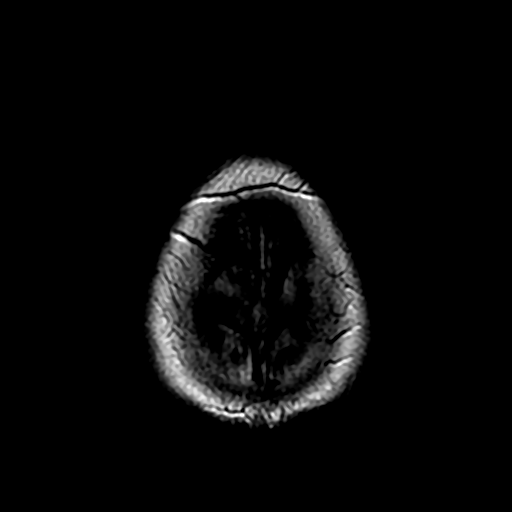

[Series 7: FLAIR · sagittal · 5.0mm · 0.47mm/px · 2 of 25 slices shown (2 of 2)]
[im 1/25]
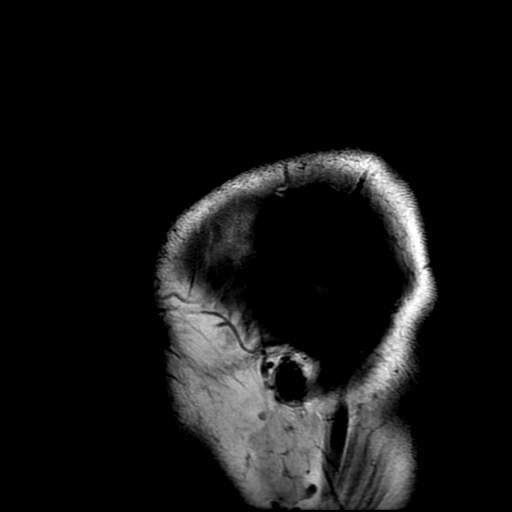
[im 25/25]
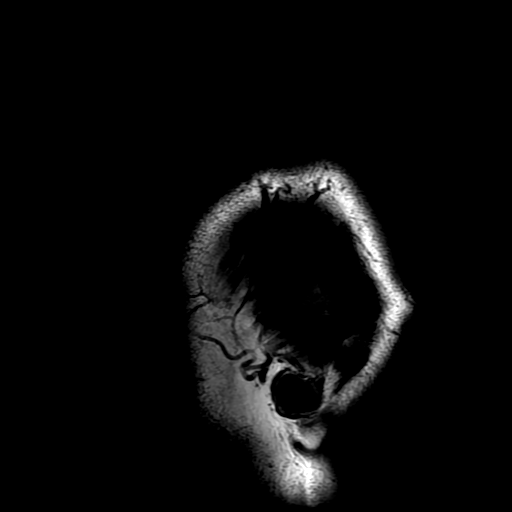

[Series 8: (person_name) · axial · 3.0mm · 0.47mm/px · z∈[-11,+25]mm · 3 of 100 slices shown]
[im 1/100]
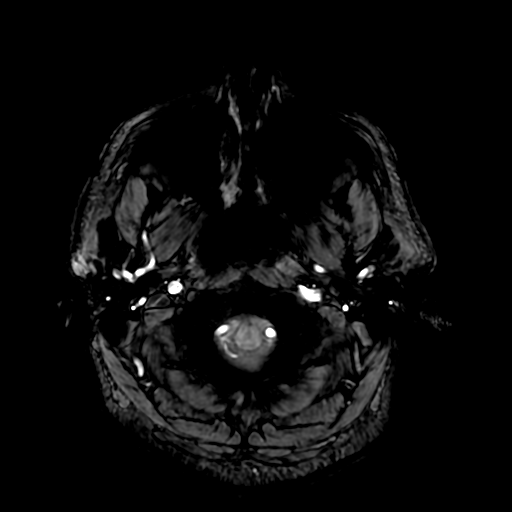
[im 13/100]
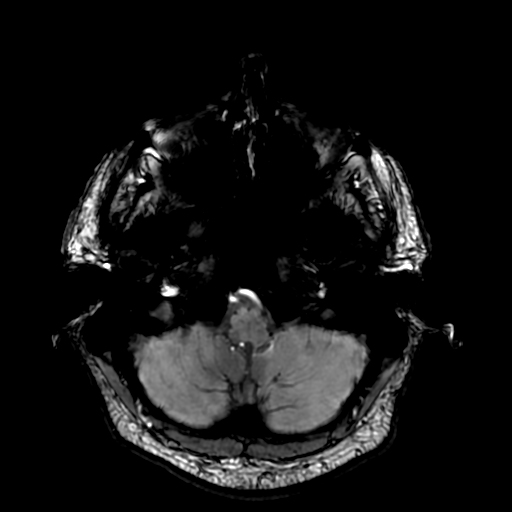
[im 25/100]
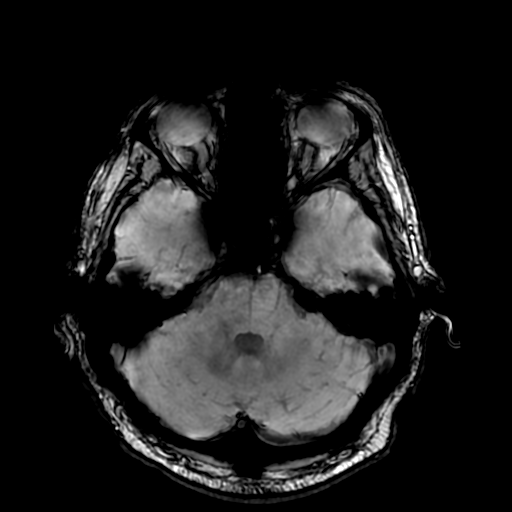

[Series 9: T2 · coronal · 5.0mm · 0.43mm/px · 3 of 30 slices shown (2 of 2)]
[im 1/30]
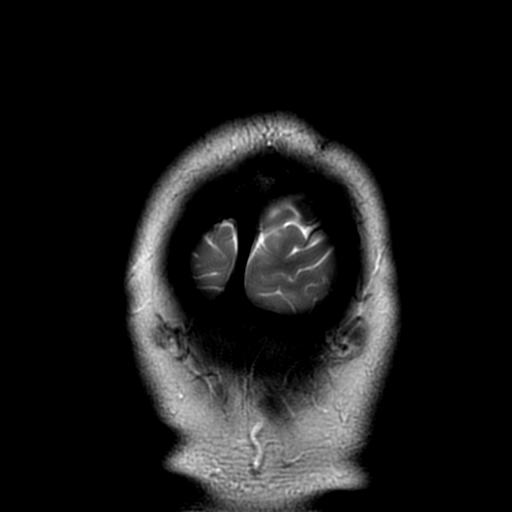
[im 15/30]
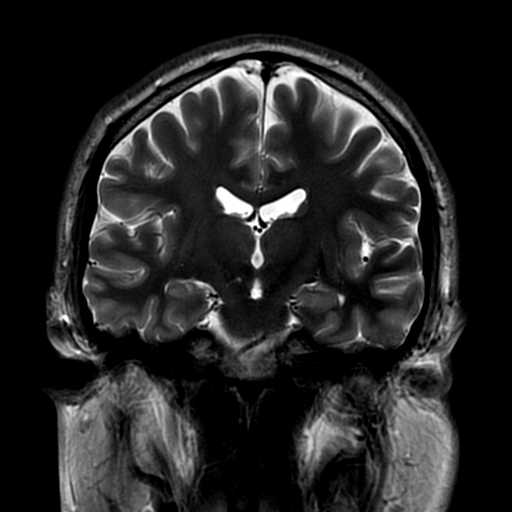
[im 30/30]
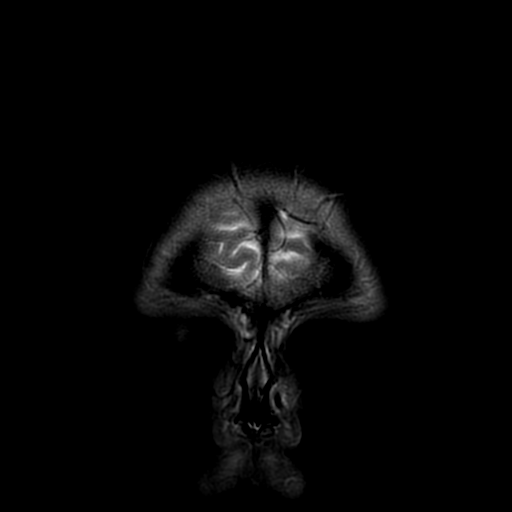

[Series 350: ADC · axial · 3.0mm · 0.94mm/px · z∈[-9,+135]mm · 5 of 49 slices shown (1 of 2)]
[im 1/49]
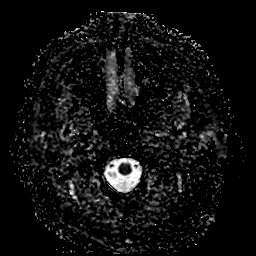
[im 13/49]
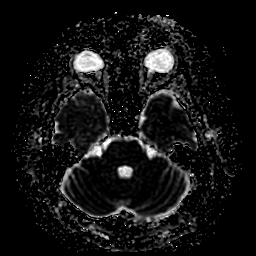
[im 25/49]
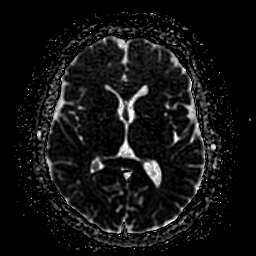
[im 37/49]
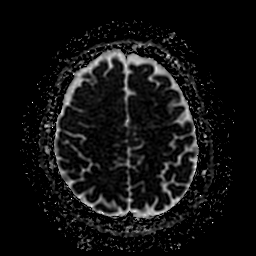
[im 49/49]
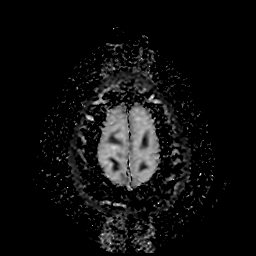

[Series 550: ADC · coronal · 4.0mm · 0.94mm/px · 3 of 36 slices shown (2 of 2)]
[im 1/36]
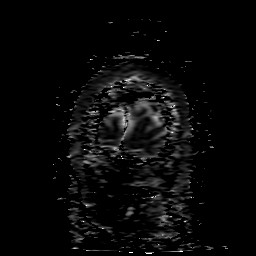
[im 18/36]
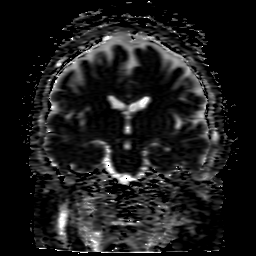
[im 36/36]
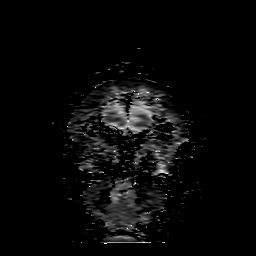

[35 of 48 positions shown; findings below may reference images not displayed]

FINDINGS: Brain: The midline structures are normal. There is no focal
diffusion restriction to indicate acute infarct. The brain
parenchymal signal is normal and there is no mass lesion. No
intraparenchymal hematoma or chronic microhemorrhage. Brain volume
is normal for age without age-advanced or lobar predominant atrophy.
The dura is normal and there is no extra-axial collection.

Vascular: Major intracranial arterial and venous sinus flow voids
are preserved.

Skull and upper cervical spine: The visualized skull base,
calvarium, upper cervical spine and extracranial soft tissues are
normal.

Sinuses/Orbits: No fluid levels or advanced mucosal thickening. No
mastoid effusion. Normal orbits.
IMPRESSION: Normal MRI of the brain.

## 2019-07-26 ENCOUNTER — Other Ambulatory Visit: Payer: Self-pay

## 2019-07-26 DIAGNOSIS — Z20822 Contact with and (suspected) exposure to covid-19: Secondary | ICD-10-CM

## 2019-07-28 LAB — NOVEL CORONAVIRUS, NAA: SARS-CoV-2, NAA: DETECTED — AB

## 2023-10-14 ENCOUNTER — Other Ambulatory Visit (HOSPITAL_BASED_OUTPATIENT_CLINIC_OR_DEPARTMENT_OTHER): Payer: Self-pay | Admitting: Internal Medicine

## 2023-10-14 DIAGNOSIS — E7800 Pure hypercholesterolemia, unspecified: Secondary | ICD-10-CM

## 2023-12-03 ENCOUNTER — Ambulatory Visit (HOSPITAL_COMMUNITY)
Admission: RE | Admit: 2023-12-03 | Discharge: 2023-12-03 | Disposition: A | Discharge status: Home / Self Care | Payer: 59 | Source: Ambulatory Visit | Attending: Internal Medicine | Admitting: Internal Medicine

## 2023-12-03 DIAGNOSIS — E7800 Pure hypercholesterolemia, unspecified: Secondary | ICD-10-CM | POA: Insufficient documentation
